# Patient Record
Sex: Male | Born: 1985 | Race: White | Hispanic: No | Marital: Married | Smoking: Never smoker
Health system: Southern US, Community
[De-identification: ages and names within clinical notes are randomized; demographics above are authoritative.]

---

## 2019-10-06 ENCOUNTER — Encounter (HOSPITAL_BASED_OUTPATIENT_CLINIC_OR_DEPARTMENT_OTHER): Payer: Self-pay | Admitting: Emergency Medicine

## 2019-10-06 ENCOUNTER — Emergency Department (HOSPITAL_BASED_OUTPATIENT_CLINIC_OR_DEPARTMENT_OTHER)
Admission: EM | Admit: 2019-10-06 | Discharge: 2019-10-06 | Disposition: A | Discharge status: Home / Self Care | Payer: Managed Care, Other (non HMO) | Attending: Emergency Medicine | Admitting: Emergency Medicine

## 2019-10-06 ENCOUNTER — Other Ambulatory Visit: Payer: Self-pay

## 2019-10-06 ENCOUNTER — Emergency Department (HOSPITAL_BASED_OUTPATIENT_CLINIC_OR_DEPARTMENT_OTHER): Payer: Managed Care, Other (non HMO)

## 2019-10-06 DIAGNOSIS — N132 Hydronephrosis with renal and ureteral calculous obstruction: Secondary | ICD-10-CM | POA: Diagnosis not present

## 2019-10-06 DIAGNOSIS — N179 Acute kidney failure, unspecified: Secondary | ICD-10-CM | POA: Diagnosis not present

## 2019-10-06 DIAGNOSIS — E876 Hypokalemia: Secondary | ICD-10-CM | POA: Diagnosis not present

## 2019-10-06 DIAGNOSIS — R1032 Left lower quadrant pain: Secondary | ICD-10-CM | POA: Diagnosis present

## 2019-10-06 LAB — URINALYSIS, MICROSCOPIC (REFLEX): RBC / HPF: >50 RBC/hpf (ref 0–5)

## 2019-10-06 LAB — CBC
HCT: 44 % (ref 39.0–52.0)
Hemoglobin: 15.3 g/dL (ref 13.0–17.0)
MCH: 29.6 pg (ref 26.0–34.0)
MCHC: 34.8 g/dL (ref 30.0–36.0)
MCV: 85.1 fL (ref 80.0–100.0)
Platelets: 236 10*3/uL (ref 150–400)
RBC: 5.17 MIL/uL (ref 4.22–5.81)
RDW: 11.9 % (ref 11.5–15.5)
WBC: 7.4 10*3/uL (ref 4.0–10.5)
nRBC: 0 % (ref 0.0–0.2)

## 2019-10-06 LAB — BASIC METABOLIC PANEL
Anion gap: 12 (ref 5–15)
BUN: 21 mg/dL — ABNORMAL HIGH (ref 6–20)
CO2: 21 mmol/L — ABNORMAL LOW (ref 22–32)
Calcium: 9.4 mg/dL (ref 8.9–10.3)
Chloride: 103 mmol/L (ref 98–111)
Creatinine, Ser: 1.45 mg/dL — ABNORMAL HIGH (ref 0.61–1.24)
GFR calc Af Amer: >60 mL/min (ref >60)
GFR calc non Af Amer: >60 mL/min (ref >60)
Glucose, Bld: 137 mg/dL — ABNORMAL HIGH (ref 70–99)
Potassium: 2.9 mmol/L — ABNORMAL LOW (ref 3.5–5.1)
Sodium: 136 mmol/L (ref 135–145)

## 2019-10-06 LAB — URINALYSIS, ROUTINE W REFLEX MICROSCOPIC
Bilirubin Urine: NEGATIVE
Glucose, UA: NEGATIVE mg/dL
Ketones, ur: NEGATIVE mg/dL
Leukocytes,Ua: NEGATIVE
Nitrite: NEGATIVE
Protein, ur: NEGATIVE mg/dL
Specific Gravity, Urine: >1.03 — ABNORMAL HIGH (ref 1.005–1.030)
pH: 6 (ref 5.0–8.0)

## 2019-10-06 MED ORDER — HYDROMORPHONE HCL 1 MG/ML IJ SOLN
1.0000 mg | Freq: Once | INTRAMUSCULAR | Status: AC
  Administered 2019-10-06: 1 mg via INTRAVENOUS
  Filled 2019-10-06: qty 1

## 2019-10-06 MED ORDER — LACTATED RINGERS IV BOLUS
1000.0000 mL | Freq: Once | INTRAVENOUS | Status: AC
  Administered 2019-10-06: 1000 mL via INTRAVENOUS

## 2019-10-06 MED ORDER — SODIUM CHLORIDE 0.9 % IV BOLUS
1000.0000 mL | Freq: Once | INTRAVENOUS | Status: AC
  Administered 2019-10-06: 08:00:00 1000 mL via INTRAVENOUS

## 2019-10-06 MED ORDER — KETOROLAC TROMETHAMINE 30 MG/ML IJ SOLN
30.0000 mg | Freq: Once | INTRAMUSCULAR | Status: AC
  Administered 2019-10-06: 30 mg via INTRAVENOUS
  Filled 2019-10-06: qty 1

## 2019-10-06 MED ORDER — POTASSIUM CHLORIDE CRYS ER 20 MEQ PO TBCR
40.0000 meq | EXTENDED_RELEASE_TABLET | Freq: Once | ORAL | Status: AC
  Administered 2019-10-06: 09:00:00 40 meq via ORAL
  Filled 2019-10-06: qty 2

## 2019-10-06 MED ORDER — OXYCODONE-ACETAMINOPHEN 5-325 MG PO TABS: 2.0000 | ORAL_TABLET | ORAL | 0 refills | Status: AC | PRN

## 2019-10-06 MED ORDER — FENTANYL CITRATE (PF) 100 MCG/2ML IJ SOLN
100.0000 ug | Freq: Once | INTRAMUSCULAR | Status: AC
  Administered 2019-10-06: 100 ug via INTRAVENOUS
  Filled 2019-10-06: qty 2

## 2019-10-06 MED ORDER — ONDANSETRON 4 MG PO TBDP: 4.0000 mg | ORAL_TABLET | Freq: Three times a day (TID) | ORAL | 0 refills | Status: AC | PRN

## 2019-10-06 MED ORDER — ONDANSETRON HCL 4 MG/2ML IJ SOLN
4.0000 mg | Freq: Once | INTRAMUSCULAR | Status: AC
  Administered 2019-10-06: 4 mg via INTRAVENOUS
  Filled 2019-10-06: qty 2

## 2019-10-06 MED FILL — ONDANSETRON ODT 4 MG TABLET: 4 | 2 days supply | Qty: 6 | Fill #0

## 2019-10-06 MED FILL — OXYCODONE-ACETAMINOPHEN 5-3: 5-325 | 1 days supply | Qty: 12 | Fill #0

## 2019-10-06 NOTE — ED Triage Notes (Signed)
Pt was waken up at 0630 with left side/flank pain.  No history.  Pt states he tried to urinate but unable.  No fever. Urinating yesterday ok.

## 2019-10-06 NOTE — Discharge Instructions (Signed)
1) take pain medication and nausea medication as needed for symptoms.  The pain medication contains Tylenol.  The pain medication may make you sleepy so do not drive or operate machinery while taking the medication. 2) contact urology clinic for follow-up appointment. 3) urinate with strainer and save stone if you are able to obtain it. 4) eat potassium rich foods for your mild low potassium. 5) follow-up with primary care provider to have basic metabolic panel rechecked to ensure that your kidney function returns to normal and your potassium returns to normal. 6) return to the ER if you have any severe pain, fever, or severe vomiting.

## 2019-10-06 NOTE — ED Provider Notes (Signed)
[facility] EMERGENCY DEPARTMENT Provider Note   CSN: [id number] Arrival date & time: 10/06/19  [id number]     History   Chief Complaint Chief Complaint  Patient presents with   Flank Pain    HPI Lewis MoccasinMichael Langworthy is a 33 y.o. male.     Healthy 33 year old male who presents with left side pain.  This morning around 6:30 AM, he woke up with sudden onset of severe, constant left side pain associated with nausea and dry heaves.  He has never had pain like this before.  He urinated a Damichael Hofman bit but then was not able to urinate anymore.  He did not notice any blood.  He has had no fevers, cough/cold symptoms, or recent illness.  No history of kidney stones.  No medications prior to arrival.  The history is provided by the patient.  Flank Pain    No past medical history on file.  There are no active problems to display for this patient.   ** The histories are not reviewed yet. Please review them in the "History" navigator section and refresh this SmartLink.    PMH: Denies PSH: Denies   Home Medications    Prior to Admission medications   Medication Sig Start Date End Date Taking? Authorizing Provider  ondansetron (ZOFRAN ODT) 4 MG disintegrating tablet Take 1 tablet (4 mg total) by mouth every 8 (eight) hours as needed for nausea or vomiting. 10/06/19   Lenisha Lacap, Ambrose Finlandachel Morgan, MD  oxyCODONE-acetaminophen (PERCOCET) 5-325 MG tablet Take 2 tablets by mouth every 4 (four) hours as needed for up to 3 days. 10/06/19 10/09/19  Kevis Qu, Ambrose Finlandachel Morgan, MD    Family History No family history on file.  Social History Social History   Tobacco Use   Smoking status: Never Smoker   Smokeless tobacco: Never Used  Substance Use Topics   Alcohol use: Not on file   Drug use: Not on file     Allergies   Patient has no known allergies.   Review of Systems Review of Systems  Genitourinary: Positive for flank pain.   All other systems reviewed and are negative except  that which was mentioned in HPI   Physical Exam Updated Vital Signs BP 132/83    Pulse (!) 54    Temp (!) 97.5 F (36.4 C) (Oral)    Resp 20    Ht 5\' 9"  (1.753 m)    Wt 68.9 kg    SpO2 94%    BMI 22.45 kg/m   Physical Exam Vitals signs and nursing note reviewed.  Constitutional:      General: He is in acute distress.     Appearance: He is well-developed.     Comments: Moaning, in distress due to pain  HENT:     Head: Normocephalic and atraumatic.  Eyes:     Conjunctiva/sclera: Conjunctivae normal.     Pupils: Pupils are equal, round, and reactive to light.  Neck:     Musculoskeletal: Neck supple.  Cardiovascular:     Rate and Rhythm: Normal rate and regular rhythm.     Heart sounds: Normal heart sounds. No murmur.  Pulmonary:     Effort: Pulmonary effort is normal.     Breath sounds: Normal breath sounds.  Abdominal:     General: Bowel sounds are normal. There is no distension.     Palpations: Abdomen is soft.     Tenderness: There is abdominal tenderness.    Skin:    General: Skin is warm and  dry.  Neurological:     Mental Status: He is alert and oriented to person, place, and time.     Comments: Fluent speech  Psychiatric:        Judgment: Judgment normal.      ED Treatments / Results  Labs (all labs ordered are listed, but only abnormal results are displayed) Labs Reviewed  URINALYSIS, ROUTINE W REFLEX MICROSCOPIC - Abnormal; Notable for the following components:      Result Value   APPearance CLOUDY (*)    Specific Gravity, Urine >1.030 (*)    Hgb urine dipstick LARGE (*)    All other components within normal limits  BASIC METABOLIC PANEL - Abnormal; Notable for the following components:   Potassium 2.9 (*)    CO2 21 (*)    Glucose, Bld 137 (*)    BUN 21 (*)    Creatinine, Ser 1.45 (*)    All other components within normal limits  URINALYSIS, MICROSCOPIC (REFLEX) - Abnormal; Notable for the following components:   Bacteria, UA MANY (*)    All other  components within normal limits  URINE CULTURE  CBC    EKG None  Radiology Ct Renal Stone Study  Result Date: 10/06/2019 CLINICAL DATA:  Left-sided flank region and lower quadrant pain. Dysuria EXAM: CT ABDOMEN AND PELVIS WITHOUT CONTRAST TECHNIQUE: Multidetector CT imaging of the abdomen and pelvis was performed following the standard protocol without oral or IV contrast. COMPARISON:  None. FINDINGS: Lower chest: Lung bases are clear.  There is a small hiatal hernia. Hepatobiliary: No focal liver lesions are apparent on this noncontrast enhanced study. Gallbladder wall is not appreciably thickened. There is no biliary duct dilatation. Pancreas: There is no pancreatic mass or inflammatory focus. Spleen: No splenic lesions are evident. Adrenals/Urinary Tract: Adrenals bilaterally appear normal. There is no evident renal mass on either side. There is mild hydronephrosis on the left. There is no hydronephrosis on the right. There is no renal calculus on either side. There is a 1 mm calculus at the left ureterovesical junction. No other ureteral calculi are evident. Urinary bladder is midline with wall thickness within normal limits. Note that there are several apparent phleboliths in the pelvis which are near but separate from distal ureters. Stomach/Bowel: There is no appreciable bowel wall or mesenteric thickening. There is no evident bowel obstruction. Terminal ileum appears normal. No free air or portal venous air. Vascular/Lymphatic: There is no abdominal aortic aneurysm. No vascular lesions are evident on this noncontrast enhanced study. There is no evident adenopathy in the abdomen or pelvis. Reproductive: There are small prostatic calculi. Prostate and seminal vesicles are normal in size and contour. No evident pelvic mass. Other: Appendix appears normal. No evident abscess or ascites in the abdomen or pelvis. Musculoskeletal: No blastic or lytic bone lesions are evident. There is no intramuscular or  abdominal wall lesion. IMPRESSION: 1. 1 mm calculus at the left ureterovesical junction with mild hydronephrosis on the left. 2. No bowel obstruction. No abscess in the abdomen or pelvis. Appendix appears normal. 3.  Small hiatal hernia. Electronically Signed   By: Bretta Bang III M.D.   On: 10/06/2019 09:06    Procedures Procedures (including critical care time)  Medications Ordered in ED Medications  fentaNYL (SUBLIMAZE) injection 100 mcg (100 mcg Intravenous Given 10/06/19 0746)  ondansetron (ZOFRAN) injection 4 mg (4 mg Intravenous Given 10/06/19 0746)  sodium chloride 0.9 % bolus 1,000 mL (0 mLs Intravenous Stopped 10/06/19 0905)  HYDROmorphone (DILAUDID) injection 1  mg (1 mg Intravenous Given 10/06/19 0839)  lactated ringers bolus 1,000 mL (1,000 mLs Intravenous New Bag/Given 10/06/19 0902)  ketorolac (TORADOL) 30 MG/ML injection 30 mg (30 mg Intravenous Given 10/06/19 0923)  potassium chloride SA (KLOR-CON) CR tablet 40 mEq (40 mEq Oral Given 10/06/19 4332)     Initial Impression / Assessment and Plan / ED Course  I have reviewed the triage vital signs and the nursing notes.  Pertinent labs & imaging results that were available during my care of the patient were reviewed by me and considered in my medical decision making (see chart for details).       In distress 2/2 pain on exam, mildly hypertensive, afebrile. Obtained renal CT to evaluate for kidney stone, less likely diverticulitis given age and pt without diarrhea.  UA is nitrite and leukocyte negative, large amount of blood and some bacteria.  Urine culture added.  Lab work shows potassium 2.9, creatinine 1.45, normal CBC.  CT showing 1 mm stone at the left UVJ with mild hydronephrosis.  Patient more comfortable on reassessment.  I have discussed supportive measures including pain medication/nausea medication as needed, strainer when urinating, continued hydration at home.  Discussed potassium supplementation with foods.   Instructed to have PCP recheck BMP for creatinine and potassium level.  Provided with urology follow-up information.  Reviewed return precautions and he voiced understanding. Final Clinical Impressions(s) / ED Diagnoses   Final diagnoses:  Ureteral stone with hydronephrosis  Hypokalemia  AKI (acute kidney injury) [facility])    ED Discharge Orders         Ordered    oxyCODONE-acetaminophen (PERCOCET) 5-325 MG tablet  Every 4 hours PRN     10/06/19 0936    ondansetron (ZOFRAN ODT) 4 MG disintegrating tablet  Every 8 hours PRN     10/06/19 0936           Tidus Upchurch, Wenda Overland, MD 10/06/19 [id number]

## 2019-10-06 NOTE — ED Notes (Signed)
Patient transported to CT 

## 2019-10-07 LAB — URINE CULTURE: Culture: NO GROWTH

## 2021-05-28 IMAGING — CT CT RENAL STONE PROTOCOL
2 of 4 series · 16 of 46 positions shown, 18 images · non-contrast
Comparison: None.

CLINICAL DATA: Left-sided flank region and lower quadrant pain.
Dysuria

EXAM:
CT ABDOMEN AND PELVIS WITHOUT CONTRAST
TECHNIQUE: Multidetector CT imaging of the abdomen and pelvis was performed
following the standard protocol without oral or IV contrast.

[Series 2: axial st · axial · 0.77mm/px · z∈[-632,-222]mm · 13 of 90 slices shown, 15 images]
[im 4/90  soft-tissue]
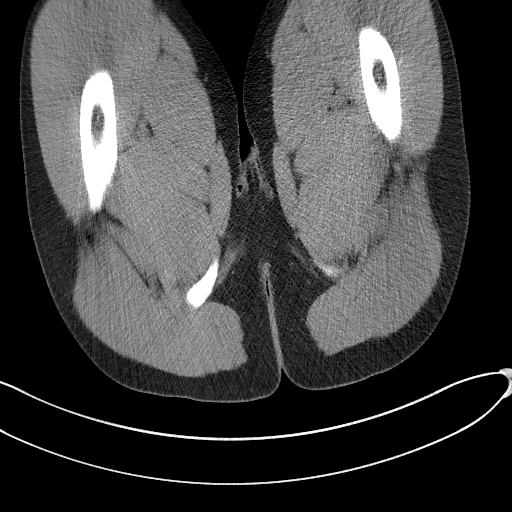
[im 4/90  bone]
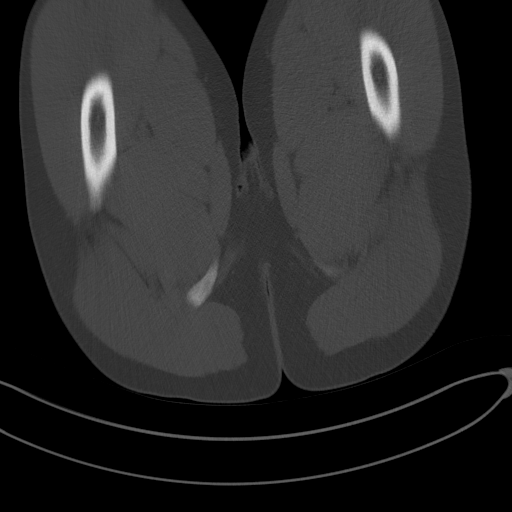
[im 11/90  soft-tissue]
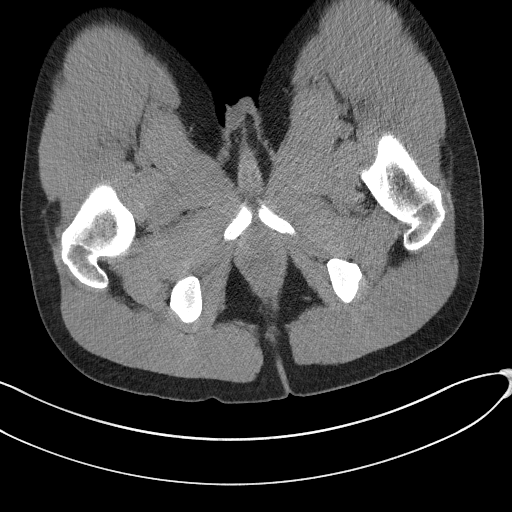
[im 18/90  soft-tissue]
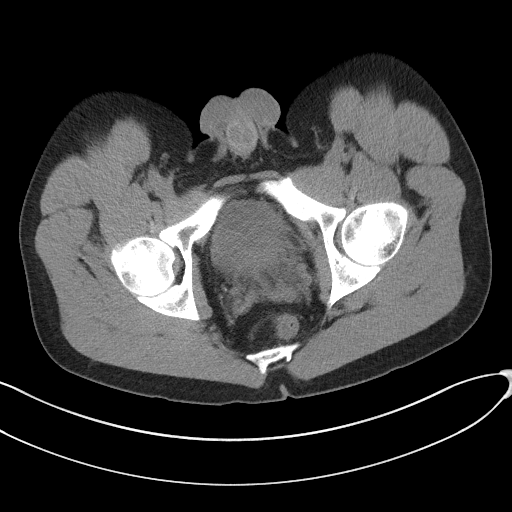
[im 24/90  soft-tissue]
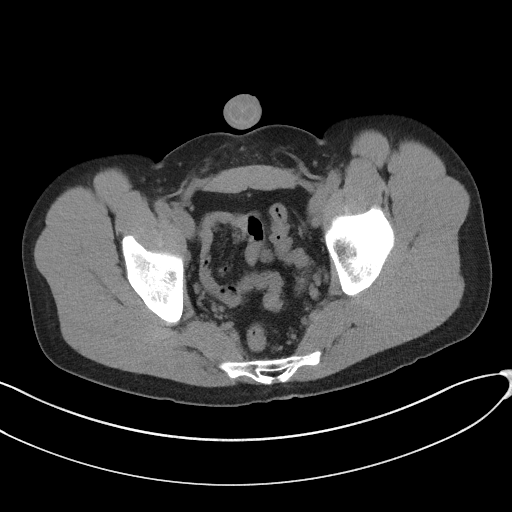
[im 31/90  soft-tissue]
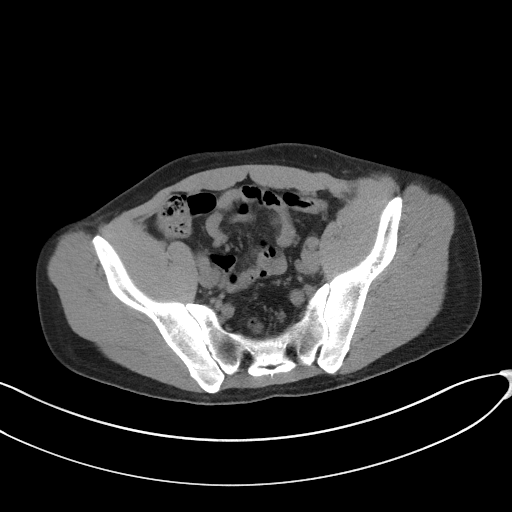
[im 38/90  soft-tissue]
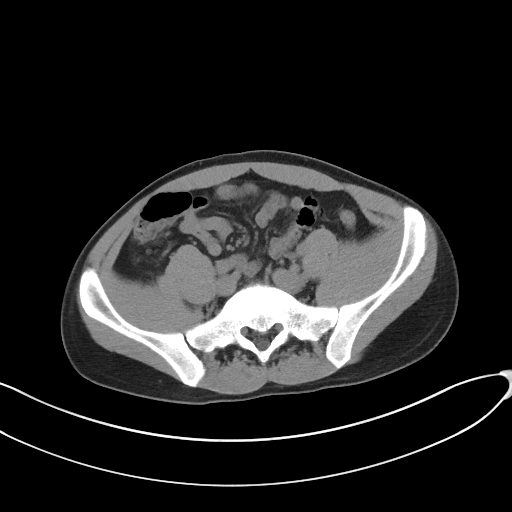
[im 45/90  soft-tissue]
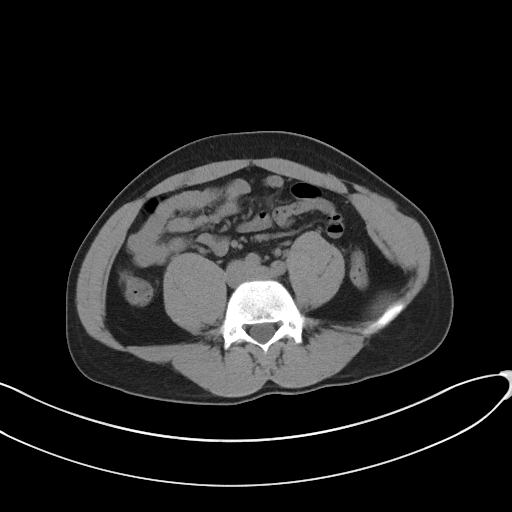
[im 52/90  soft-tissue]
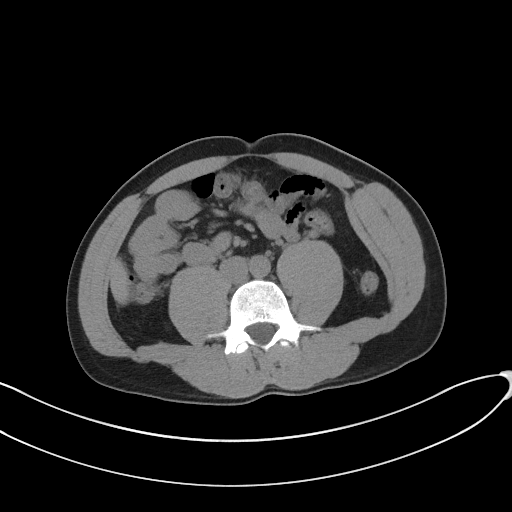
[im 59/90  soft-tissue]
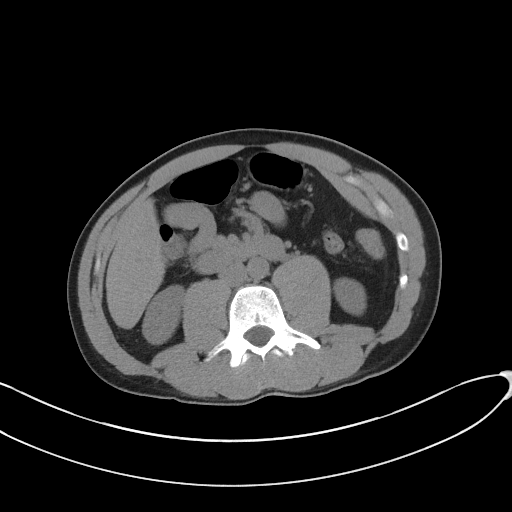
[im 59/90  bone]
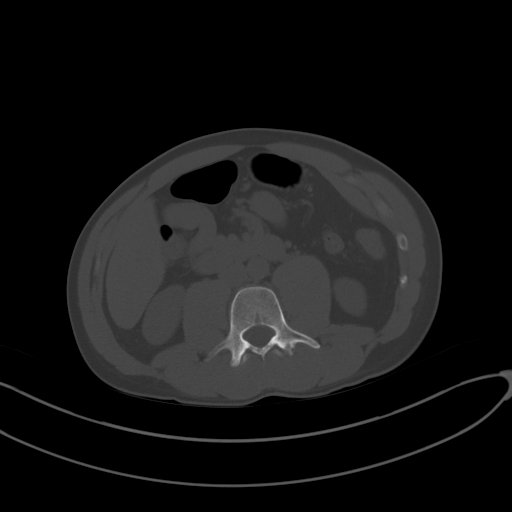
[im 66/90  soft-tissue]
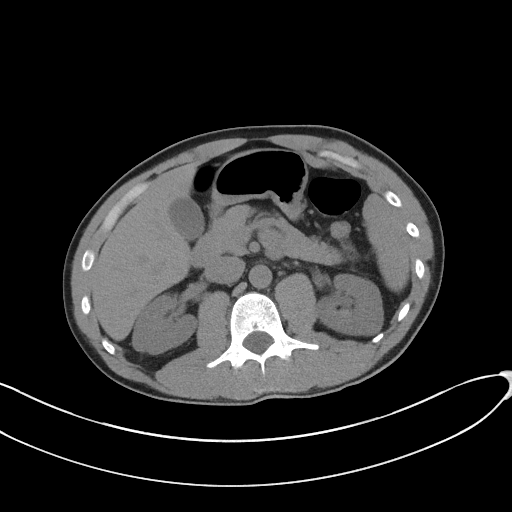
[im 72/90  soft-tissue]
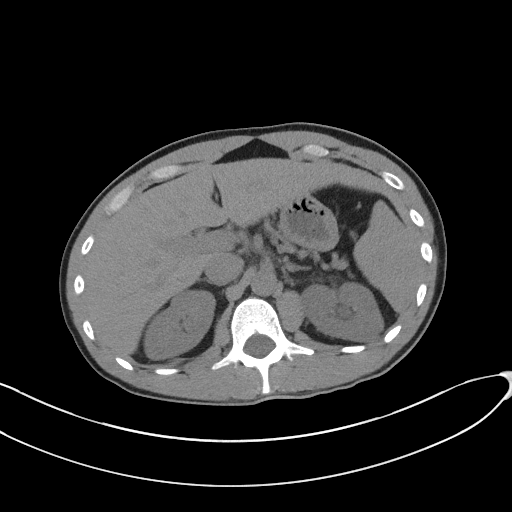
[im 79/90  soft-tissue]
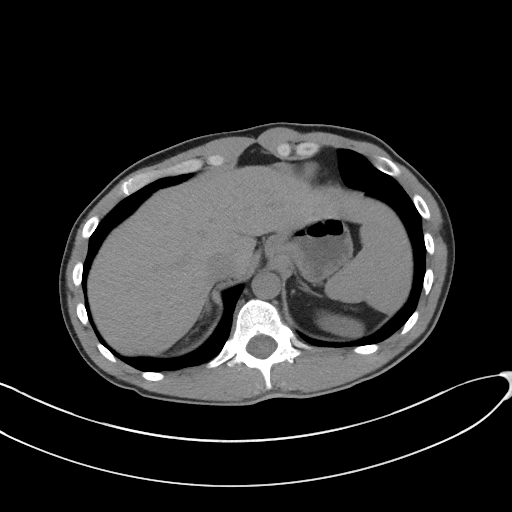
[im 86/90  soft-tissue]
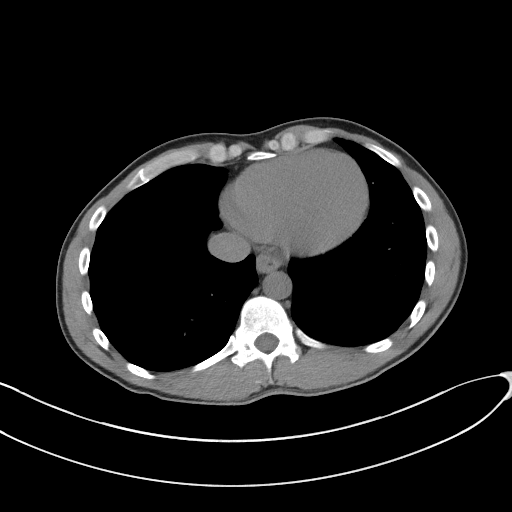

[Series 5: coronal st · coronal · 0.82mm/px · 3 of 100 slices shown]
[im 34/100  soft-tissue]
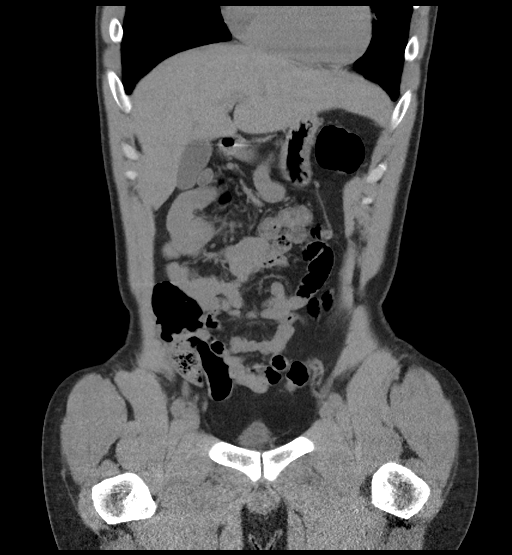
[im 45/100  soft-tissue]
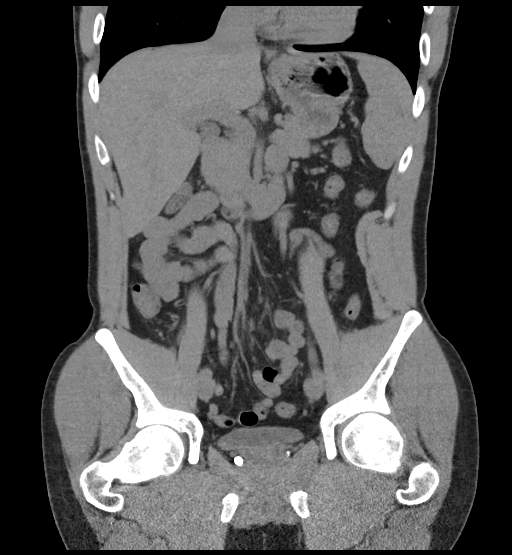
[im 56/100  soft-tissue]
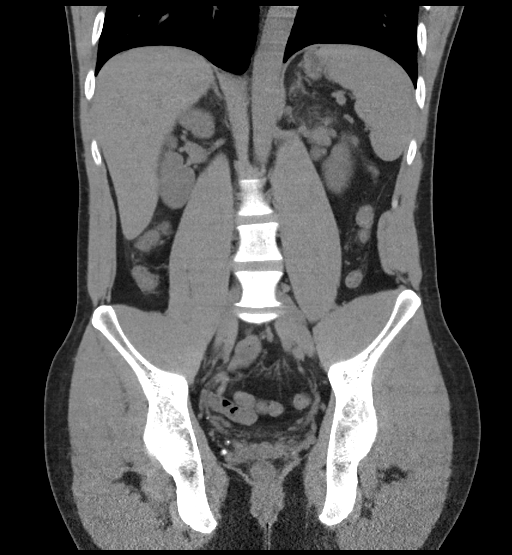

[16 of 46 positions shown; findings below may reference images not displayed]

FINDINGS: Lower chest: Lung bases are clear.  There is a small hiatal hernia.

Hepatobiliary: No focal liver lesions are apparent on this
noncontrast enhanced study. Gallbladder wall is not appreciably
thickened. There is no biliary duct dilatation.

Pancreas: There is no pancreatic mass or inflammatory focus.

Spleen: No splenic lesions are evident.

Adrenals/Urinary Tract: Adrenals bilaterally appear normal. There is
no evident renal mass on either side. There is mild hydronephrosis
on the left. There is no hydronephrosis on the right. There is no
renal calculus on either side. There is a 1 mm calculus at the left
ureterovesical junction. No other ureteral calculi are evident.
Urinary bladder is midline with wall thickness within normal limits.
Note that there are several apparent phleboliths in the pelvis which
are near but separate from distal ureters.

Stomach/Bowel: There is no appreciable bowel wall or mesenteric
thickening. There is no evident bowel obstruction. Terminal ileum
appears normal. No free air or portal venous air.

Vascular/Lymphatic: There is no abdominal aortic aneurysm. No
vascular lesions are evident on this noncontrast enhanced study.
There is no evident adenopathy in the abdomen or pelvis.

Reproductive: There are small prostatic calculi. Prostate and
seminal vesicles are normal in size and contour. No evident pelvic
mass.

Other: Appendix appears normal. No evident abscess or ascites in the
abdomen or pelvis.

Musculoskeletal: No blastic or lytic bone lesions are evident. There
is no intramuscular or abdominal wall lesion.
IMPRESSION: 1. 1 mm calculus at the left ureterovesical junction with mild
hydronephrosis on the left.

2. No bowel obstruction. No abscess in the abdomen or pelvis.
Appendix appears normal.

3.  Small hiatal hernia.
